# Patient Record
Sex: Female | Born: 1960 | Race: Black or African American | Hispanic: No | Smoking: Never smoker
Health system: Southern US, Community
[De-identification: ages and names within clinical notes are randomized; demographics above are authoritative.]

---

## 2008-10-19 ENCOUNTER — Emergency Department (HOSPITAL_COMMUNITY): Admission: EM | Admit: 2008-10-19 | Discharge: 2008-10-20 | Payer: Self-pay | Admitting: Emergency Medicine

## 2008-10-21 ENCOUNTER — Emergency Department (HOSPITAL_COMMUNITY): Admission: EM | Admit: 2008-10-21 | Discharge: 2008-10-22 | Payer: Self-pay | Admitting: Emergency Medicine

## 2010-03-11 IMAGING — CT CT CERVICAL SPINE W/O CM
4 of 5 series · 16 of 35 positions shown, 17 images · non-contrast
Comparison: None

CT HEAD

CLINICAL DATA: Head and neck pain

CT HEAD WITHOUT CONTRAST
CT CERVICAL SPINE WITHOUT CONTRAST
TECHNIQUE: Multidetector CT imaging of the head and cervical spine
was performed following the standard protocol without intravenous
contrast.  Multiplanar CT image reconstructions of the cervical
spine were also generated.

[Series 3: recon 2: brain · axial · 0.47mm/px · z∈[-102,-18]mm · 4 of 56 slices shown]
[im 12/56  bone]
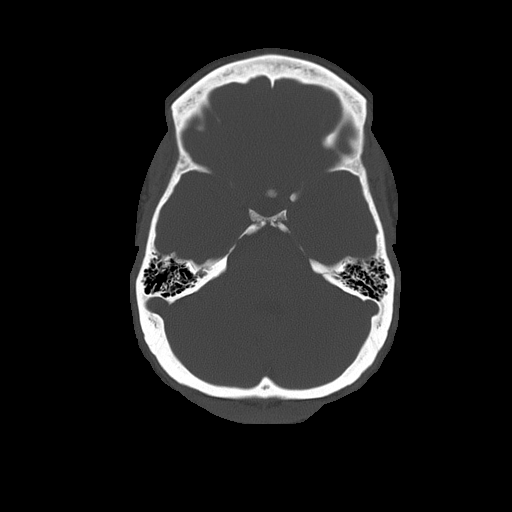
[im 23/56  bone]
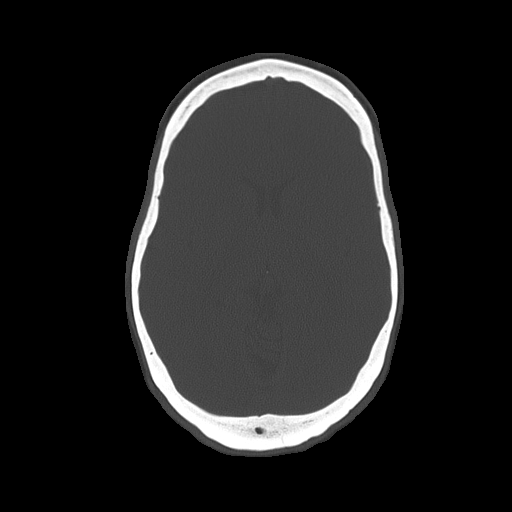
[im 34/56  bone]
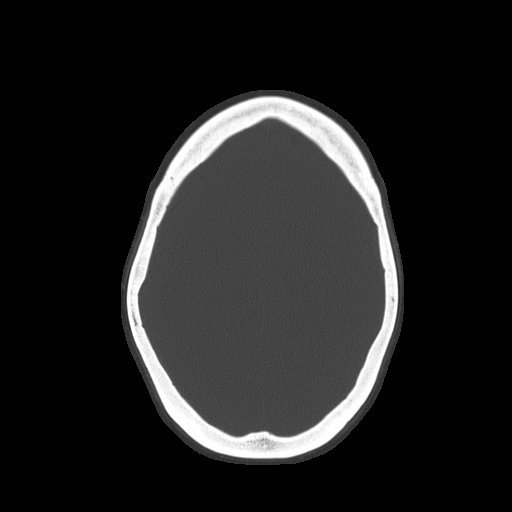
[im 45/56  bone]
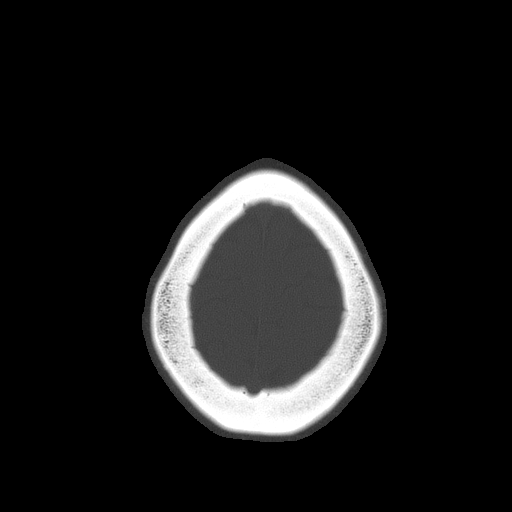

[Series 600: reformatted · sagittal · 0.32mm/px · 6 of 34 slices shown (1 of 3)]
[im 12/34  bone]
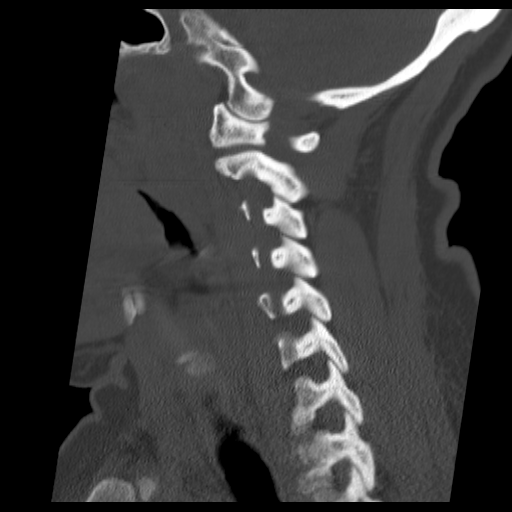
[im 14/34  bone]
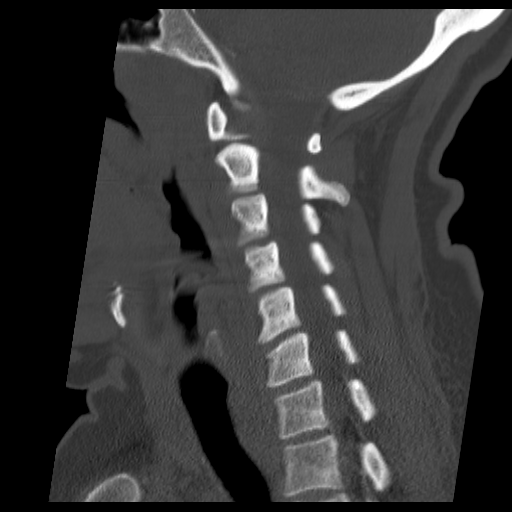
[im 17/34  bone]
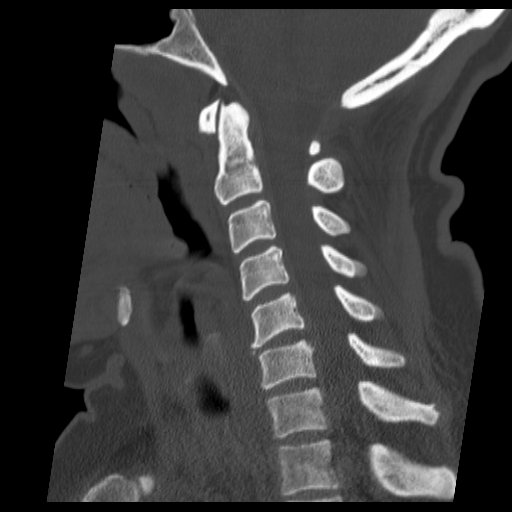
[im 20/34  bone]
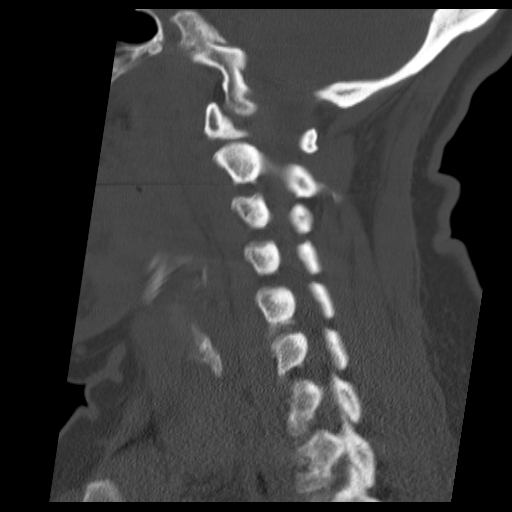
[im 23/34  bone]
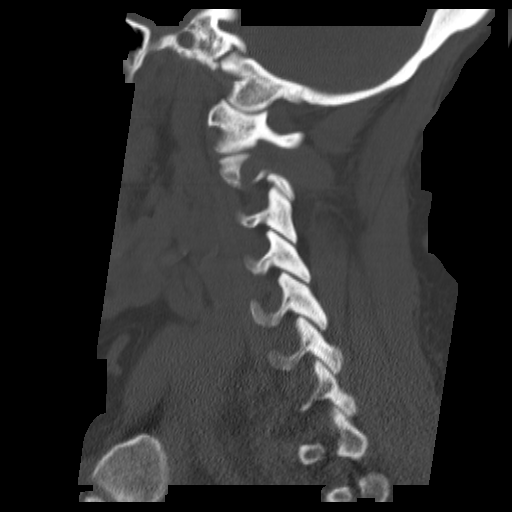
[im 31/34  soft-tissue]
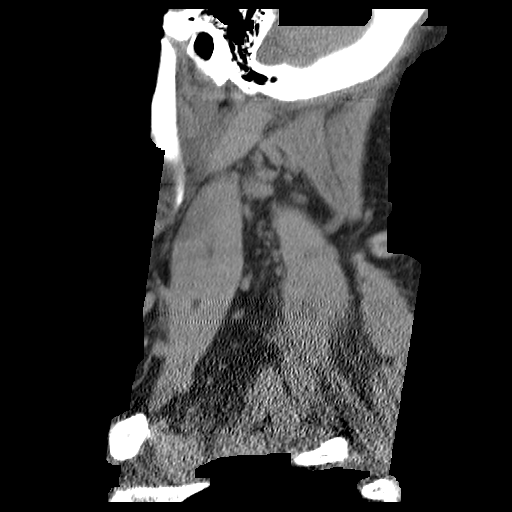

[Series 601: reformatted · coronal · 0.32mm/px · 3 of 27 slices shown (2 of 3)]
[im 6/27  bone]
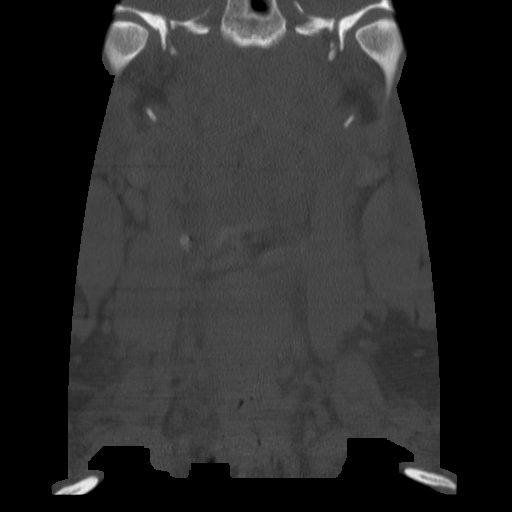
[im 11/27  bone]
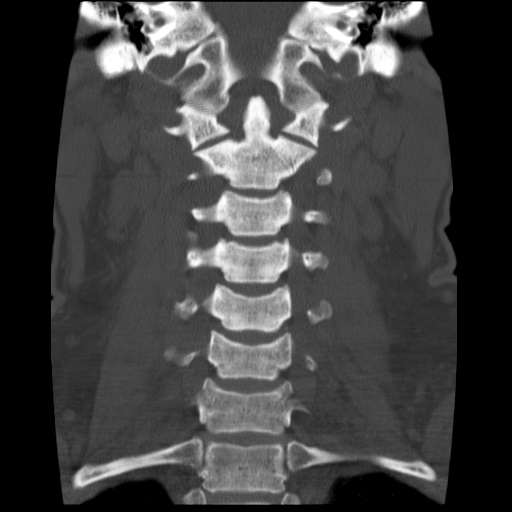
[im 16/27  bone]
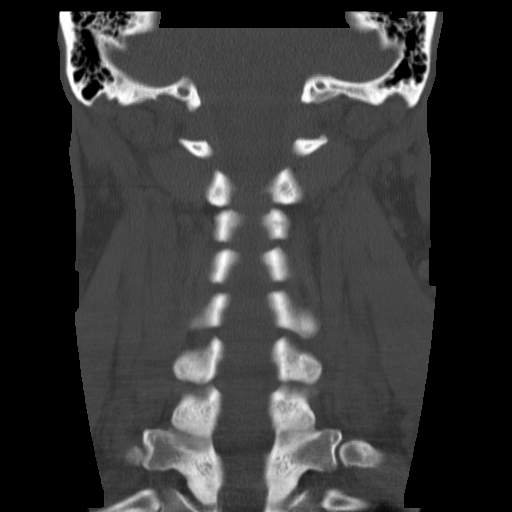

[Series 602: reformatted · axial · 0.23mm/px · z∈[-257,-189]mm · 3 of 49 slices shown, 4 images (3 of 3)]
[im 13/49  soft-tissue]
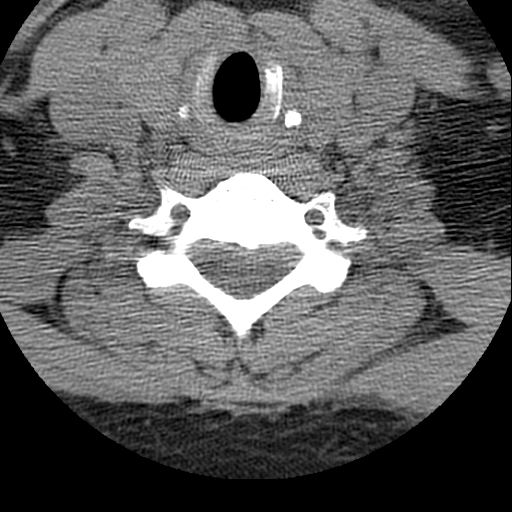
[im 13/49  bone]
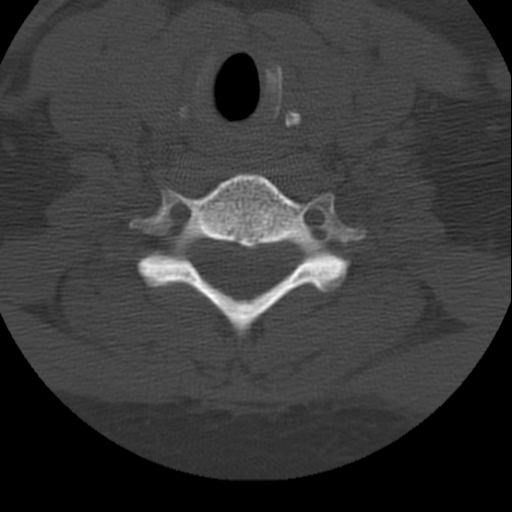
[im 25/49  bone]
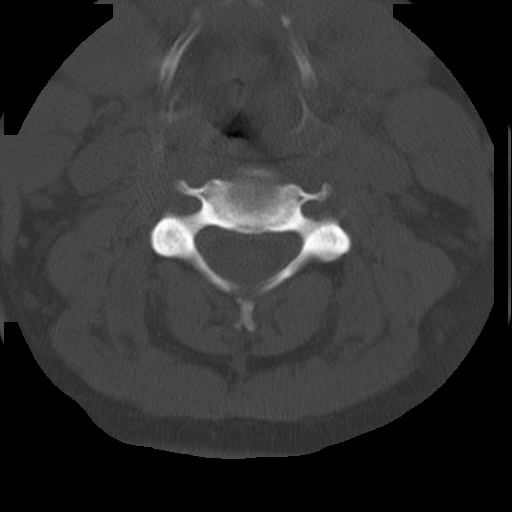
[im 37/49  bone]
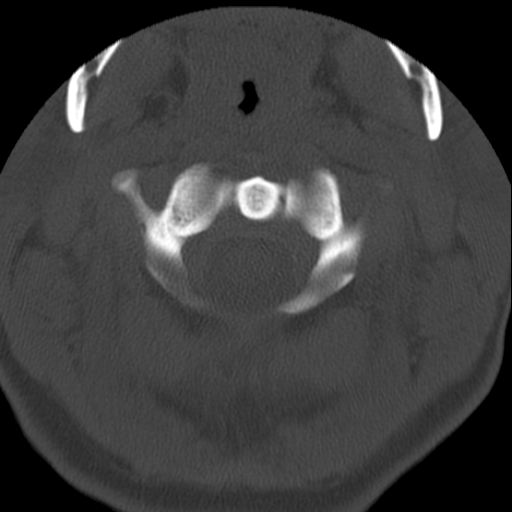

[16 of 35 positions shown; findings below may reference images not displayed]

FINDINGS: The ventricles are normal.  No extra-axial fluid
collections are seen.  No acute intracranial findings or mass
lesions.  The brainstem and cerebellum appear normal.

The bony calvarium is intact.  The visualized paranasal sinuses
mastoid air cells are clear.  The middle ear cavities are clear.
The globes are intact.
IMPRESSION: 1.  No acute intracranial findings or mass lesions.
2.  Clear paranasal sinuses, mastoid air cells and middle ear
cavities.

CT CERVICAL SPINE
FINDINGS: The sagittal reformatted images demonstrate normal
alignment of the cervical vertebral bodies.  No acute bony findings
or significant degenerative changes.  The facets are normally
aligned.  No facet or laminar fractures are seen.  The neural
foramen are patent. The C1-C2 articulations are maintained.  The
dens is normal.

Borderline in neck nodes bilaterally.  Recommend clinical
correlation.
IMPRESSION: 1.  No acute bony findings and normal alignment.
2.  Borderline neck nodes bilaterally.

## 2011-08-10 LAB — POCT I-STAT, CHEM 8
Creatinine, Ser: 0.9 mg/dL (ref 0.4–1.2)
HCT: 37 % (ref 36.0–46.0)
Hemoglobin: 12.6 g/dL (ref 12.0–15.0)
Sodium: 139 mEq/L (ref 135–145)
TCO2: 25 mmol/L (ref 0–100)

## 2011-08-10 LAB — CBC
HCT: 35.7 % — ABNORMAL LOW (ref 36.0–46.0)
MCHC: 34 g/dL (ref 30.0–36.0)
MCV: 87.4 fL (ref 78.0–100.0)
Platelets: 247 10*3/uL (ref 150–400)
RDW: 14.6 % (ref 11.5–15.5)

## 2011-08-10 LAB — DIFFERENTIAL
Basophils Absolute: 0 10*3/uL (ref 0.0–0.1)
Basophils Relative: 1 % (ref 0–1)
Eosinophils Absolute: 0.1 10*3/uL (ref 0.0–0.7)
Eosinophils Relative: 3 % (ref 0–5)
Lymphs Abs: 2.1 10*3/uL (ref 0.7–4.0)

## 2011-12-31 ENCOUNTER — Emergency Department (HOSPITAL_COMMUNITY): Payer: Self-pay

## 2011-12-31 ENCOUNTER — Other Ambulatory Visit: Payer: Self-pay

## 2011-12-31 ENCOUNTER — Emergency Department (HOSPITAL_COMMUNITY)
Admission: EM | Admit: 2011-12-31 | Discharge: 2012-01-01 | Disposition: A | Discharge status: Home / Self Care | Payer: Self-pay | Attending: Emergency Medicine | Admitting: Emergency Medicine

## 2011-12-31 ENCOUNTER — Encounter (HOSPITAL_COMMUNITY): Payer: Self-pay | Admitting: Emergency Medicine

## 2011-12-31 DIAGNOSIS — R0789 Other chest pain: Secondary | ICD-10-CM | POA: Insufficient documentation

## 2011-12-31 DIAGNOSIS — R079 Chest pain, unspecified: Secondary | ICD-10-CM | POA: Insufficient documentation

## 2011-12-31 DIAGNOSIS — R0602 Shortness of breath: Secondary | ICD-10-CM | POA: Insufficient documentation

## 2011-12-31 LAB — BASIC METABOLIC PANEL
GFR calc Af Amer: >90 mL/min (ref >90)
GFR calc non Af Amer: >90 mL/min (ref >90)
Potassium: 3.8 mEq/L (ref 3.5–5.1)
Sodium: 135 mEq/L (ref 135–145)

## 2011-12-31 LAB — CBC
MCH: 29.6 pg (ref 26.0–34.0)
Platelets: 267 10*3/uL (ref 150–400)
RBC: 4.36 MIL/uL (ref 3.87–5.11)

## 2011-12-31 LAB — DIFFERENTIAL
Basophils Relative: 1 % (ref 0–1)
Eosinophils Absolute: 0.2 10*3/uL (ref 0.0–0.7)
Lymphs Abs: 3 10*3/uL (ref 0.7–4.0)
Neutro Abs: 2 10*3/uL (ref 1.7–7.7)
Neutrophils Relative %: 35 % — ABNORMAL LOW (ref 43–77)

## 2011-12-31 LAB — POCT I-STAT TROPONIN I: Troponin i, poc: 0.01 ng/mL (ref 0.00–0.08)

## 2011-12-31 NOTE — ED Notes (Signed)
Pt alert, nad, c/o "chest wall pain", onset several weeks ago, pt states sudden onset, denies trauma or injnury, states pain is non radiating, center of chest, resp even unlabored, skin pwd

## 2011-12-31 NOTE — ED Notes (Signed)
Pt is a very health conscious woman who has lost 68 lbs.  3 wks ago she noticed a tightness in her chest/back where it is hard for her to get a deep breath.  Pt has a knot in between her breasts that she noticed about a week ago.  States that when she rubs her chest it feels a little bit better.

## 2012-01-01 MED ORDER — MELOXICAM 7.5 MG PO TABS: 7.5000 mg | ORAL_TABLET | Freq: Every day | ORAL | Status: AC

## 2012-01-01 NOTE — ED Provider Notes (Signed)
Medical screening examination/treatment/procedure(s) were performed by non-physician practitioner and as supervising physician I was immediately available for consultation/collaboration.   Vida Roller, MD 01/01/12 [phone]

## 2012-01-01 NOTE — ED Provider Notes (Signed)
History     CSN: [id number]  Arrival date & time 12/31/11  2131   First MD Initiated Contact with Patient 12/31/11 2233      Chief Complaint  Patient presents with  . Pleurisy    (Consider location/radiation/quality/duration/timing/severity/associated sxs/prior treatment) HPI Comments: Patient presents with a two-week history of substernal chest pain that does not radiate patient states that the pain is episodic in the center of her chest increases with palpation and deep inspiration reports mild shortness of breath denies nausea vomiting radiation of pain fevers chills cough or congestion  Patient is a 51 y.o. female presenting with chest pain. The history is provided by the patient. No language interpreter was used.  Chest Pain The chest pain began 1 - 2 weeks ago. Chest pain occurs intermittently. The chest pain is unchanged. The pain is associated with breathing. At its most intense, the pain is at 5/10. The pain is currently at 5/10. The severity of the pain is moderate. The quality of the pain is described as aching, dull and pleuritic. The pain does not radiate. Chest pain is worsened by deep breathing. Primary symptoms include shortness of breath. Pertinent negatives for primary symptoms include no fever, no fatigue, no syncope, no cough, no wheezing, no palpitations, no abdominal pain, no nausea, no vomiting, no dizziness and no altered mental status.  Pertinent negatives for associated symptoms include no claudication, no diaphoresis, no lower extremity edema, no near-syncope, no numbness, no orthopnea, no paroxysmal nocturnal dyspnea and no weakness. She tried nothing for the symptoms. Risk factors include no known risk factors.     History reviewed. No pertinent past medical history.  Past Surgical History  Procedure Date  . Cesarean section     No family history on file.  History  Substance Use Topics  . Smoking status: Never Smoker   . Smokeless tobacco: Not on file    . Alcohol Use: No    OB History    Grav Para Term Preterm Abortions TAB SAB Ect Mult Living                  Review of Systems  Constitutional: Negative for fever, diaphoresis and fatigue.  Respiratory: Positive for shortness of breath. Negative for cough and wheezing.   Cardiovascular: Positive for chest pain. Negative for palpitations, orthopnea, claudication, syncope and near-syncope.  Gastrointestinal: Negative for nausea, vomiting and abdominal pain.  Neurological: Negative for dizziness, weakness and numbness.  Psychiatric/Behavioral: Negative for altered mental status.  All other systems reviewed and are negative.    Allergies  Review of patient's allergies indicates no known allergies.  Home Medications  No current outpatient prescriptions on file.  BP 142/93  Pulse 71  Temp(Src) 98.3 F (36.8 C) (Oral)  Resp 18  Wt 198 lb (89.812 kg)  SpO2 95%  Physical Exam  Nursing note and vitals reviewed. Constitutional: She is oriented to person, place, and time. She appears well-developed and well-nourished. No distress.  HENT:  Head: Normocephalic and atraumatic.  Right Ear: External ear normal.  Left Ear: External ear normal.  Nose: Nose normal.  Mouth/Throat: Oropharynx is clear and moist. No oropharyngeal exudate.  Eyes: Conjunctivae are normal. Pupils are equal, round, and reactive to light. No scleral icterus.  Neck: Normal range of motion. Neck supple. No JVD present.  Cardiovascular: Normal rate, regular rhythm and normal heart sounds.  Exam reveals no gallop and no friction rub.   No murmur heard. Pulmonary/Chest: Effort normal and breath sounds normal. No  respiratory distress. She has no wheezes. She has no rales. She exhibits tenderness.       Sternal tenderness to palpation  Abdominal: Soft. Bowel sounds are normal. She exhibits no distension. There is no tenderness.  Musculoskeletal: Normal range of motion. She exhibits no edema and no tenderness.        No peripheral edema  Lymphadenopathy:    She has no cervical adenopathy.  Neurological: She is alert and oriented to person, place, and time. No cranial nerve deficit.  Skin: Skin is warm and dry. No rash noted. No erythema. No pallor.  Psychiatric: She has a normal mood and affect. Her behavior is normal. Judgment and thought content normal.    ED Course  Procedures (including critical care time)  Labs Reviewed  DIFFERENTIAL - Abnormal; Notable for the following:    Neutrophils Relative 35 (*)    Lymphocytes Relative 53 (*)    All other components within normal limits  BASIC METABOLIC PANEL - Abnormal; Notable for the following:    Glucose, Bld 104 (*)    All other components within normal limits  CBC  D-DIMER, QUANTITATIVE  POCT I-STAT TROPONIN I   Dg Chest 2 View  12/31/2011  *RADIOLOGY REPORT*  Clinical Data: Shortness of breath and chest pain.  CHEST - 2 VIEW  Comparison: None.  Findings: The lungs are well-aerated.  Mild retrocardiac opacity could reflect mild pneumonia, slightly less well characterized on the lateral view.  There is no evidence of pleural effusion or pneumothorax.  The heart is normal in size; the mediastinal contour is within normal limits.  No acute osseous abnormalities are seen.  IMPRESSION: Mild retrocardiac opacity could reflect mild pneumonia.  Original Report Authenticated By: Tonia Ghent, M.D.   Results for orders placed during the hospital encounter of 12/31/11  CBC      Component Value Range   WBC 5.7  4.0 - 10.5 (K/uL)   RBC 4.36  3.87 - 5.11 (MIL/uL)   Hemoglobin 12.9  12.0 - 15.0 (g/dL)   HCT 54.0  98.1 - 19.1 (%)   MCV 85.8  78.0 - 100.0 (fL)   MCH 29.6  26.0 - 34.0 (pg)   MCHC 34.5  30.0 - 36.0 (g/dL)   RDW 47.8  29.5 - 62.1 (%)   Platelets 267  150 - 400 (K/uL)  DIFFERENTIAL      Component Value Range   Neutrophils Relative 35 (*) 43 - 77 (%)   Neutro Abs 2.0  1.7 - 7.7 (K/uL)   Lymphocytes Relative 53 (*) 12 - 46 (%)   Lymphs Abs  3.0  0.7 - 4.0 (K/uL)   Monocytes Relative 8  3 - 12 (%)   Monocytes Absolute 0.5  0.1 - 1.0 (K/uL)   Eosinophils Relative 3  0 - 5 (%)   Eosinophils Absolute 0.2  0.0 - 0.7 (K/uL)   Basophils Relative 1  0 - 1 (%)   Basophils Absolute 0.0  0.0 - 0.1 (K/uL)  BASIC METABOLIC PANEL      Component Value Range   Sodium 135  135 - 145 (mEq/L)   Potassium 3.8  3.5 - 5.1 (mEq/L)   Chloride 101  96 - 112 (mEq/L)   CO2 25  19 - 32 (mEq/L)   Glucose, Bld 104 (*) 70 - 99 (mg/dL)   BUN 9  6 - 23 (mg/dL)   Creatinine, Ser 3.08  0.50 - 1.10 (mg/dL)   Calcium 9.7  8.4 - 65.7 (mg/dL)  GFR calc non Af Amer >90  >90 (mL/min)   GFR calc Af Amer >90  >90 (mL/min)  D-DIMER, QUANTITATIVE      Component Value Range   D-Dimer, Quant <0.22  0.00 - 0.48 (ug/mL-FEU)  POCT I-STAT TROPONIN I      Component Value Range   Troponin i, poc 0.01  0.00 - 0.08 (ng/mL)   Comment 3            Dg Chest 2 View  12/31/2011  *RADIOLOGY REPORT*  Clinical Data: Shortness of breath and chest pain.  CHEST - 2 VIEW  Comparison: None.  Findings: The lungs are well-aerated.  Mild retrocardiac opacity could reflect mild pneumonia, slightly less well characterized on the lateral view.  There is no evidence of pleural effusion or pneumothorax.  The heart is normal in size; the mediastinal contour is within normal limits.  No acute osseous abnormalities are seen.  IMPRESSION: Mild retrocardiac opacity could reflect mild pneumonia.  Original Report Authenticated By: Tonia Ghent, M.D.    Date: 01/01/2012  Rate: 66  Rhythm: normal sinus rhythm  QRS Axis: normal  Intervals: PR prolonged  ST/T Wave abnormalities: normal  Conduction Disutrbances:first-degree A-V block   Narrative Interpretation: Reviewed by Dr. Oletta Lamas  Old EKG Reviewed: none available     Atypical chest pain    MDM  Patient here with atypical chest pain, onset several weeks ago, labs normal here, plan for followup with Dr. Sharyn Lull for outpatient stress  testing.  Doubt cardiac in nature - doubt PE - has no PCP here will refer.        Izola Price [place], [place] 01/01/12 [phone]

## 2013-05-19 IMAGING — CR DG CHEST 2V
2 series · 2 of 2 positions shown · non-contrast
Comparison: None.

CLINICAL DATA: Shortness of breath and chest pain.

CHEST - 2 VIEW

[w chest pa]
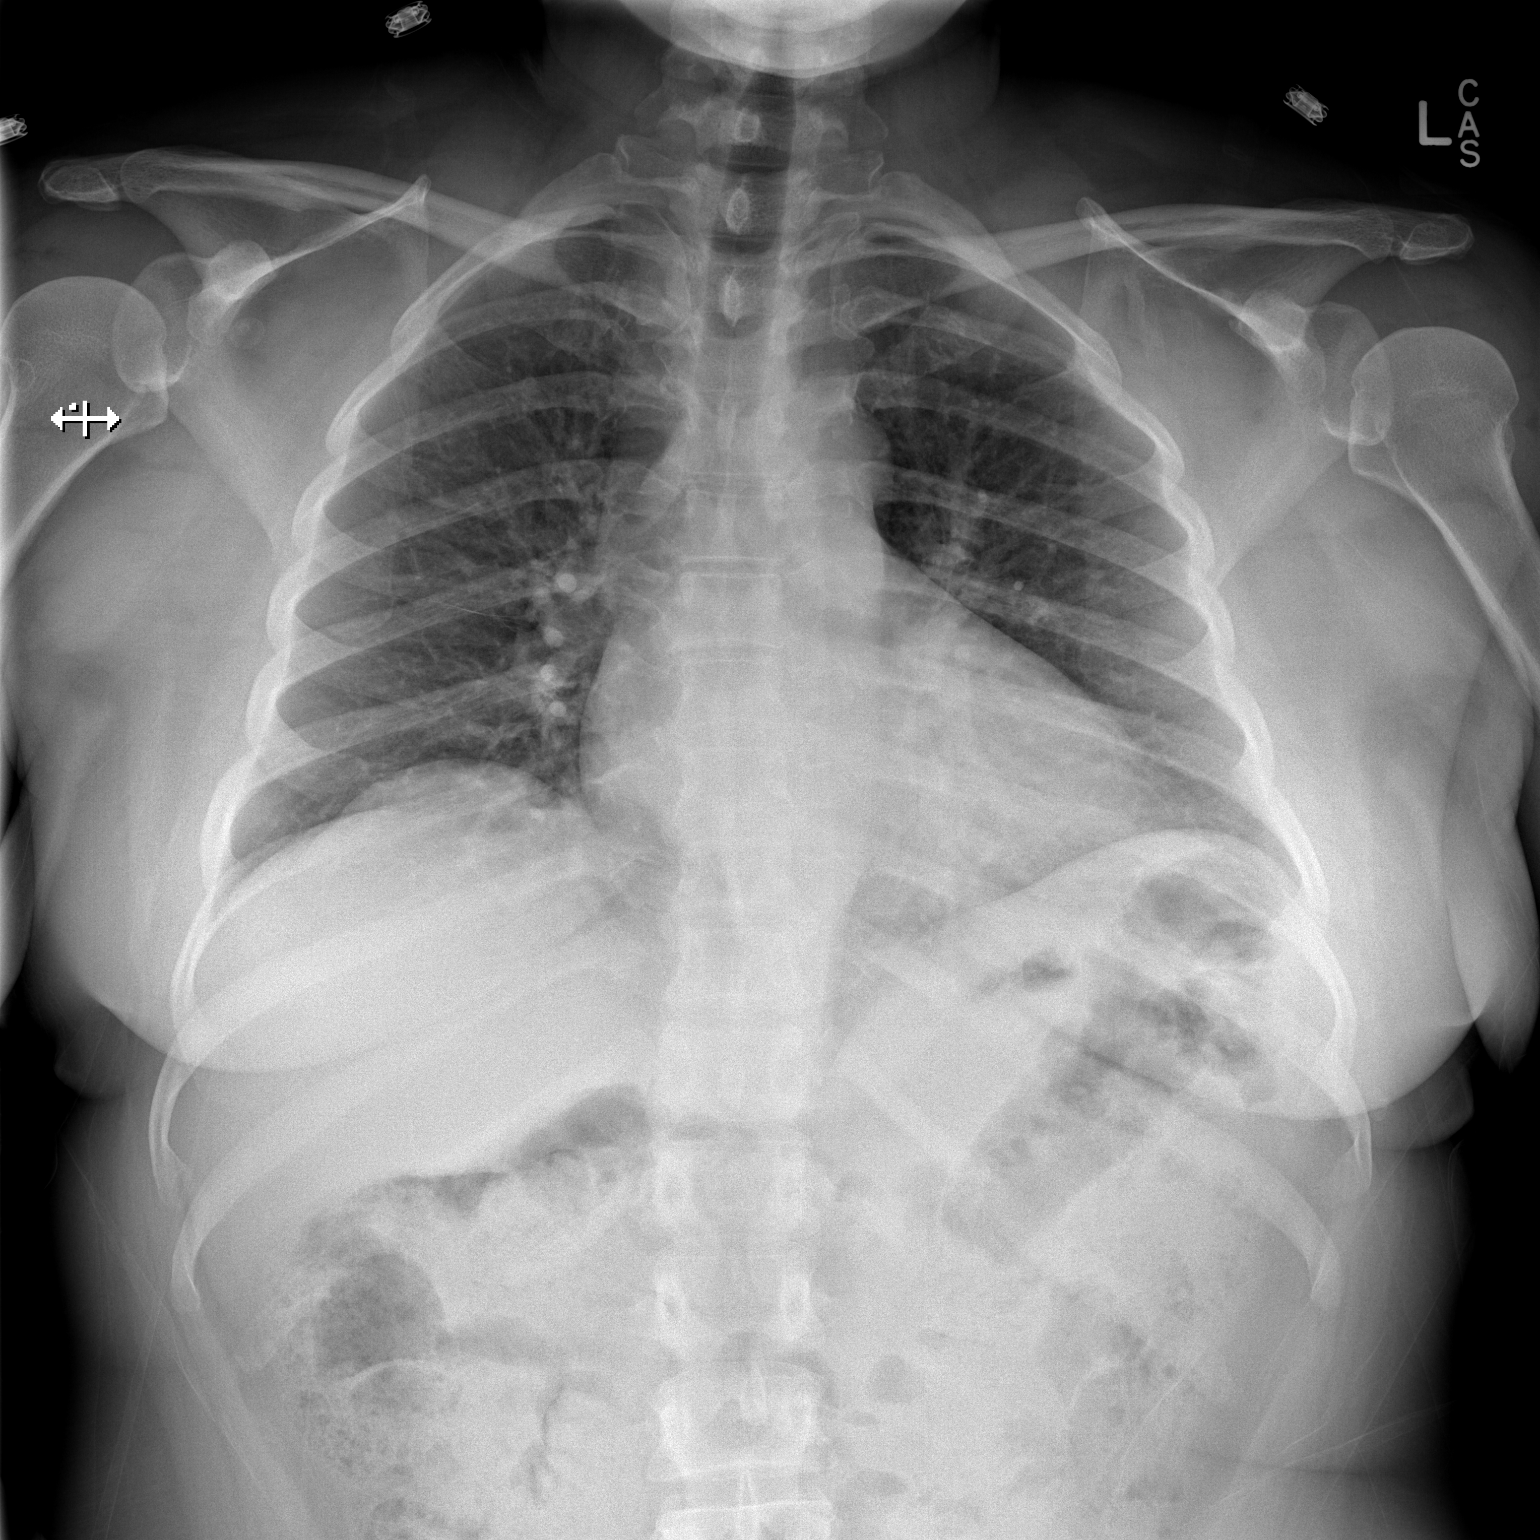

[w chest lat]
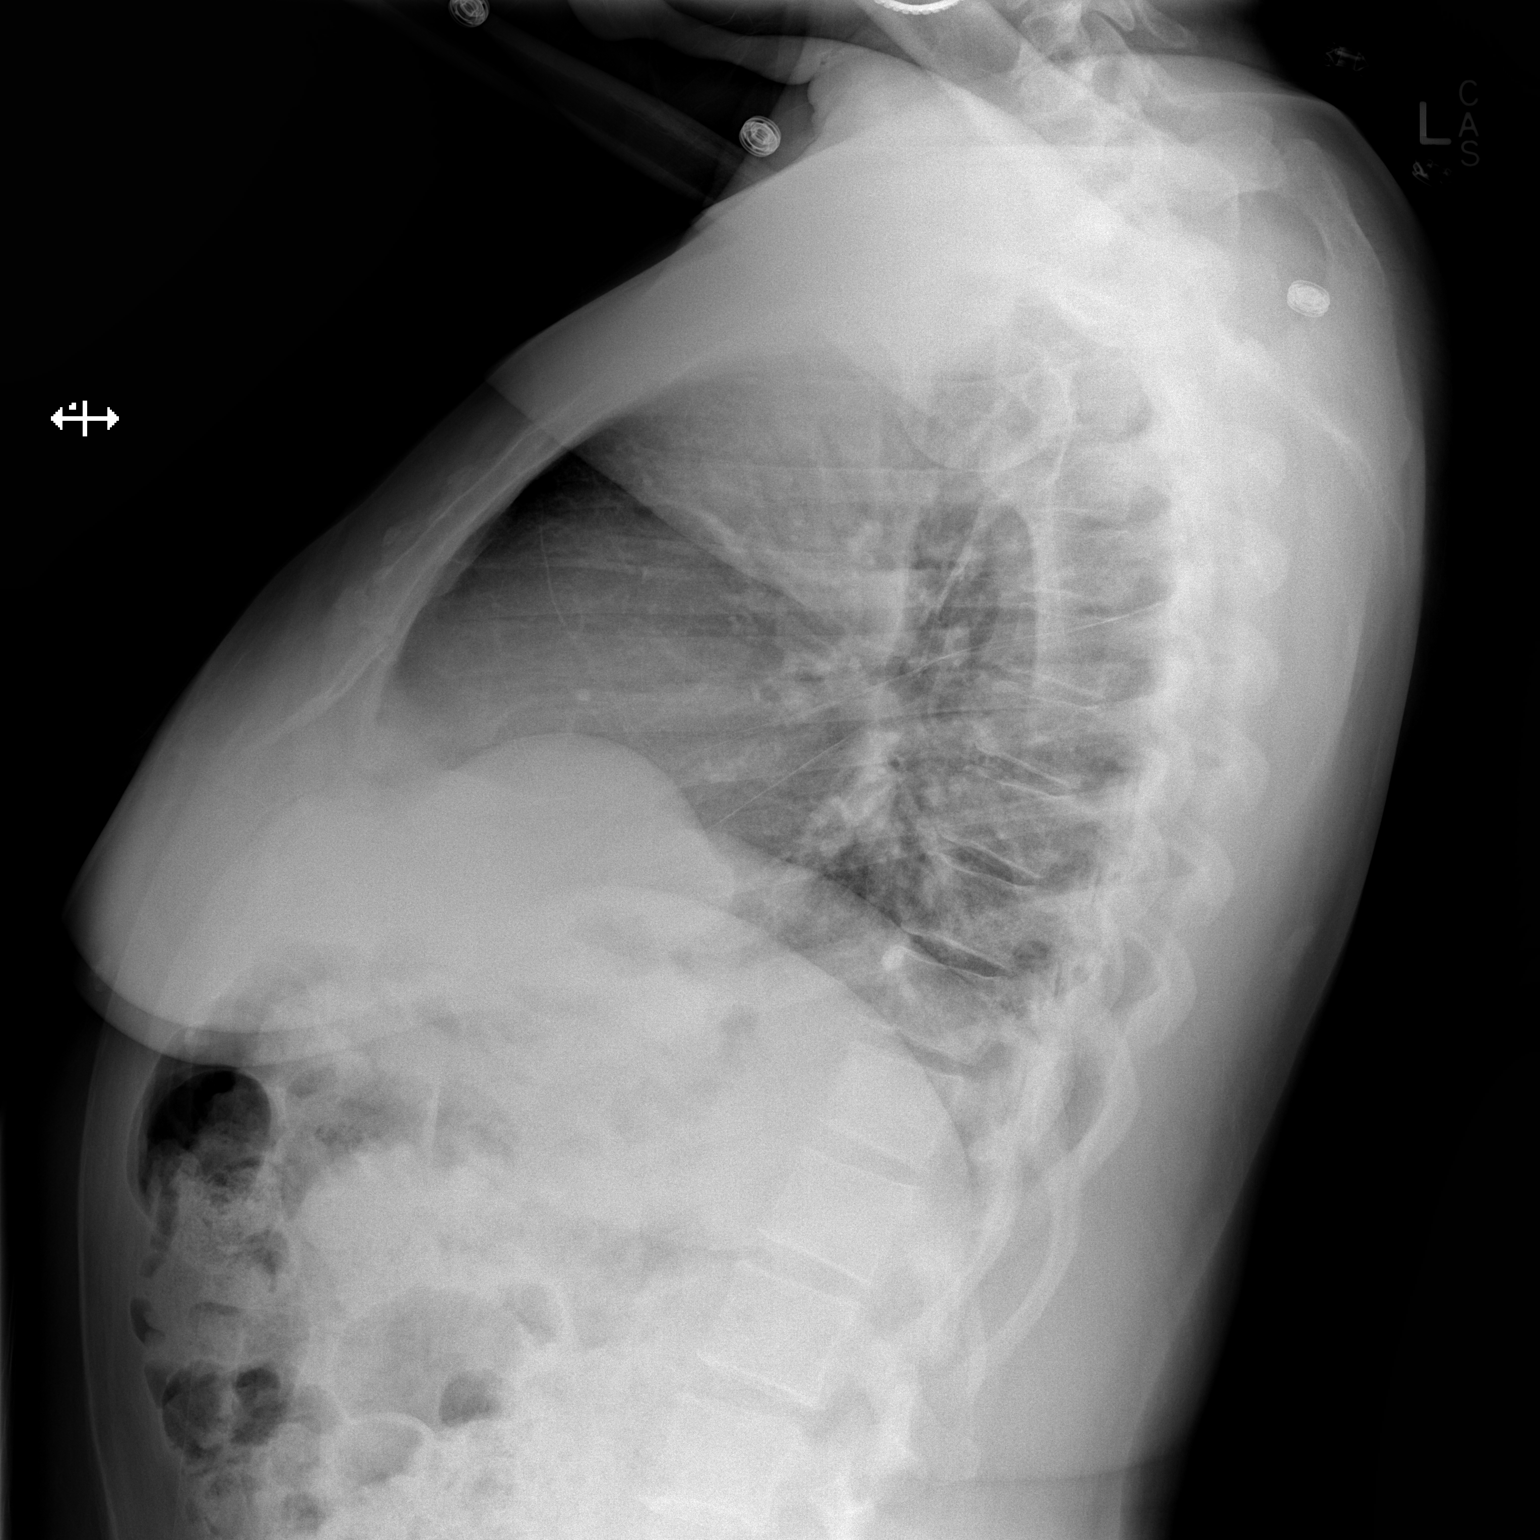

[2 of 2 positions shown; findings below may reference images not displayed]

FINDINGS: The lungs are well-aerated.  Mild retrocardiac opacity
could reflect mild pneumonia, slightly less well characterized on
the lateral view.  There is no evidence of pleural effusion or
pneumothorax.

The heart is normal in size; the mediastinal contour is within
normal limits.  No acute osseous abnormalities are seen.
IMPRESSION: Mild retrocardiac opacity could reflect mild pneumonia.

## 2016-01-17 ENCOUNTER — Other Ambulatory Visit: Payer: Self-pay | Admitting: Family Medicine

## 2016-01-17 DIAGNOSIS — M79604 Pain in right leg: Secondary | ICD-10-CM

## 2016-01-20 ENCOUNTER — Inpatient Hospital Stay: Admission: RE | Admit: 2016-01-20 | Payer: BLUE CROSS/BLUE SHIELD | Source: Ambulatory Visit

## 2016-01-23 ENCOUNTER — Ambulatory Visit
Admission: RE | Admit: 2016-01-23 | Discharge: 2016-01-23 | Disposition: A | Discharge status: Home / Self Care | Payer: BLUE CROSS/BLUE SHIELD | Source: Ambulatory Visit | Attending: Family Medicine | Admitting: Family Medicine

## 2016-01-23 DIAGNOSIS — M79604 Pain in right leg: Secondary | ICD-10-CM

## 2016-06-25 ENCOUNTER — Other Ambulatory Visit: Payer: Self-pay | Admitting: Gastroenterology

## 2016-06-25 DIAGNOSIS — R1033 Periumbilical pain: Secondary | ICD-10-CM

## 2016-11-11 ENCOUNTER — Emergency Department (HOSPITAL_COMMUNITY)
Admission: EM | Admit: 2016-11-11 | Discharge: 2016-11-12 | Disposition: A | Discharge status: Home / Self Care | Payer: BLUE CROSS/BLUE SHIELD | Attending: Emergency Medicine | Admitting: Emergency Medicine

## 2016-11-11 DIAGNOSIS — R112 Nausea with vomiting, unspecified: Secondary | ICD-10-CM | POA: Insufficient documentation

## 2016-11-11 DIAGNOSIS — R1084 Generalized abdominal pain: Secondary | ICD-10-CM | POA: Diagnosis present

## 2016-11-11 DIAGNOSIS — Z5321 Procedure and treatment not carried out due to patient leaving prior to being seen by health care provider: Secondary | ICD-10-CM | POA: Diagnosis not present

## 2016-11-12 ENCOUNTER — Encounter (HOSPITAL_COMMUNITY): Payer: Self-pay | Admitting: Oncology

## 2016-11-12 NOTE — ED Triage Notes (Signed)
Pt c/o generalized abdominal pain since July.  Pt's daughter states she ate something spicy tonight which caused the pain to exacerbate.  Pt states she has N/V.  Rates pain 10/10.

## 2016-11-12 NOTE — ED Notes (Signed)
When pt advised of the wait time she stated, "Take me to [facility]."  This Clinical research associatewriter asked pt if she was leaving to which pt responded, "What does it look like?"  Will d/c pt LWBS.

## 2017-06-11 IMAGING — US US EXTREM LOW VENOUS*R*
1 series · 13 of 24 positions shown · non-contrast
Comparison: None.

CLINICAL DATA: One month history of right leg pain and edema.



[Series 1: us extrem low venous*right* · 13 of 29 slices shown]
[im 1/29]
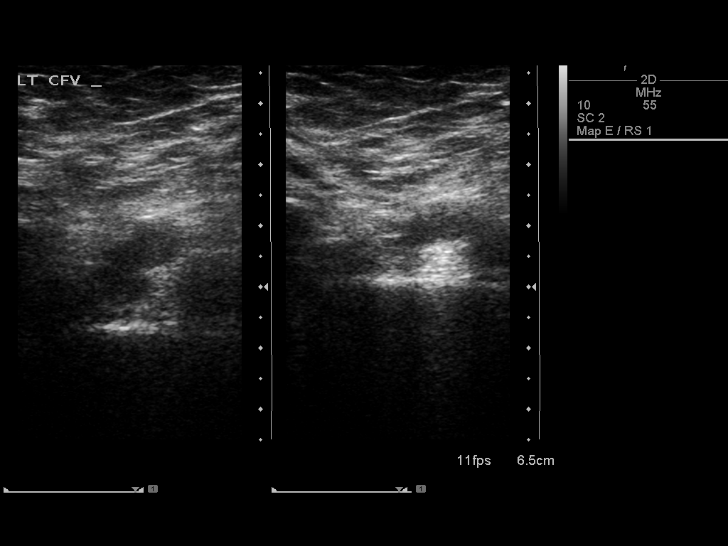
[im 3/29]
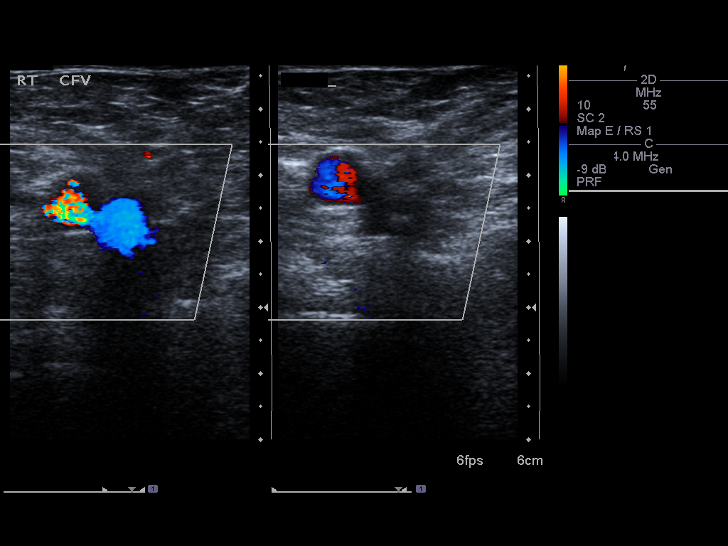
[im 5/29]
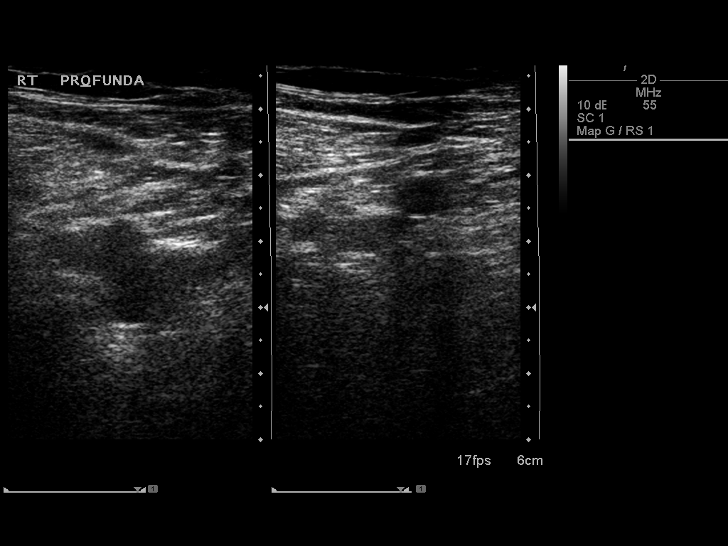
[im 8/29]
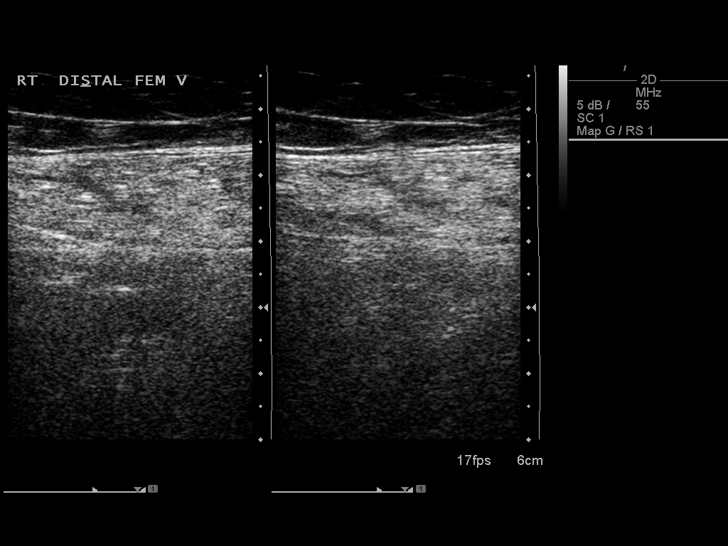
[im 10/29]
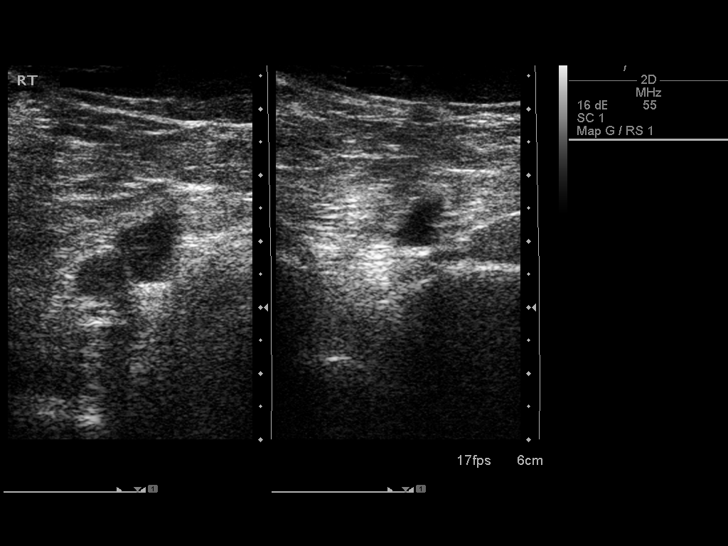
[im 13/29]
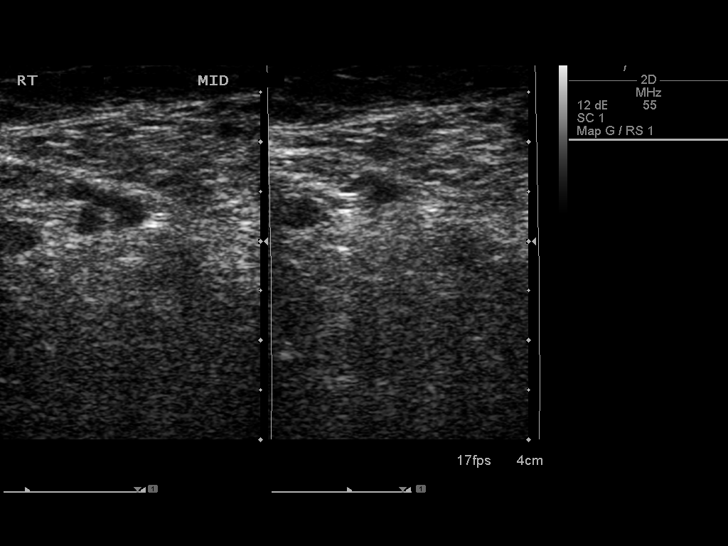
[im 15/29]
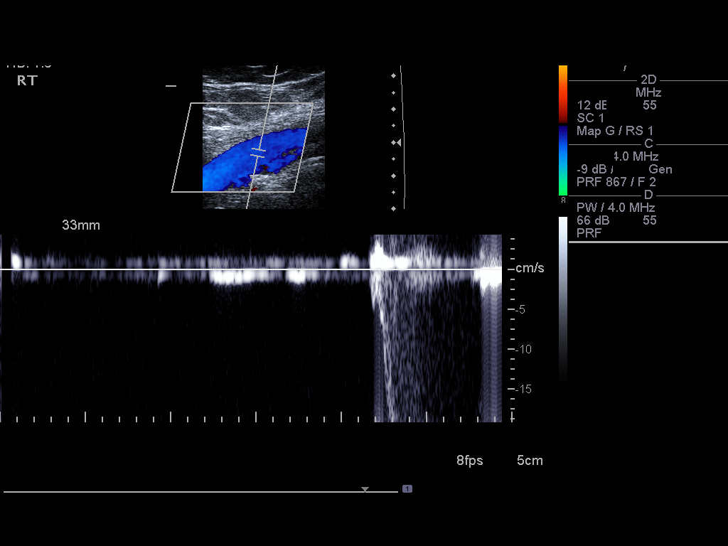
[im 16/29]
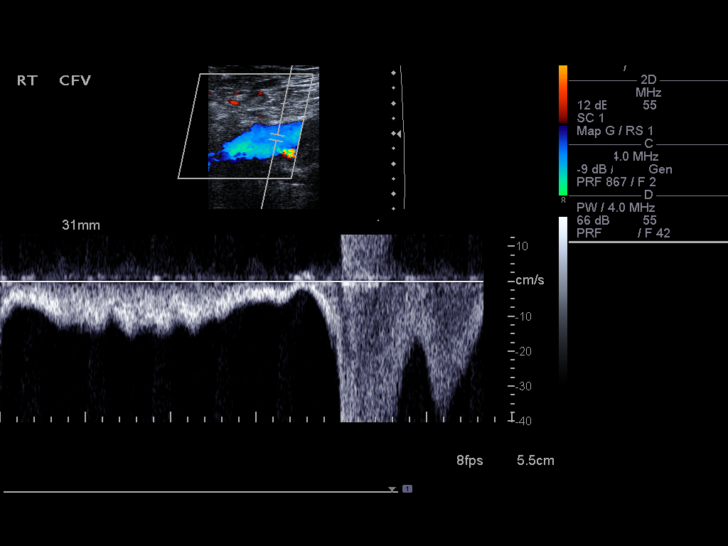
[im 19/29]
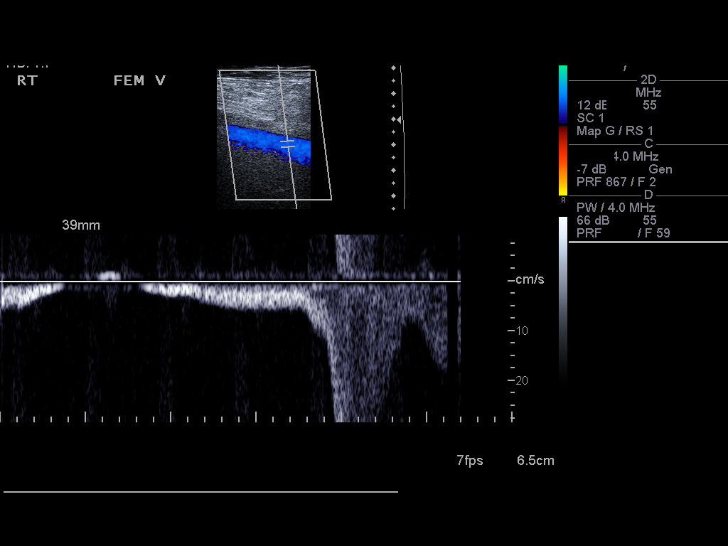
[im 21/29]
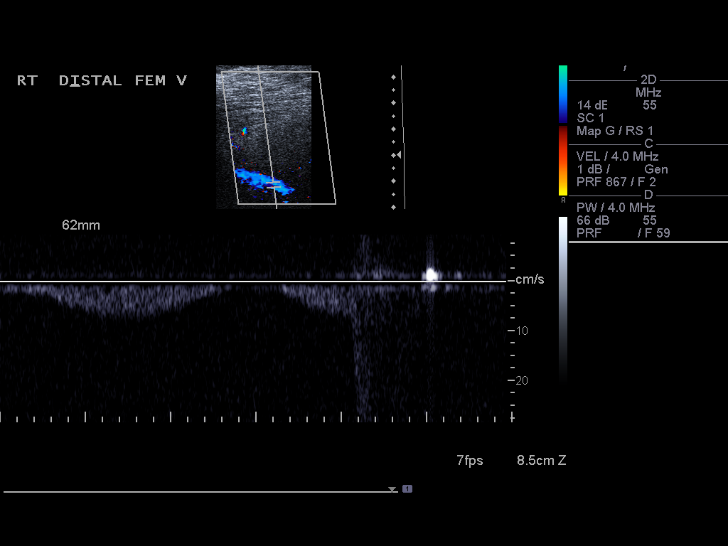
[im 24/29]
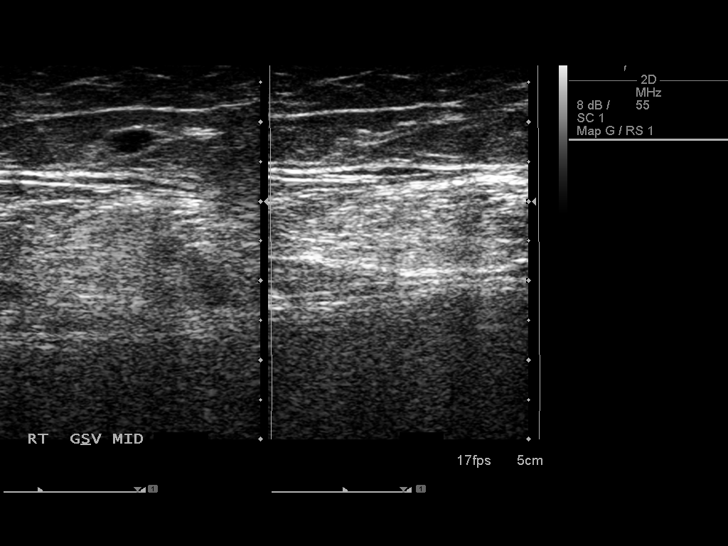
[im 26/29]
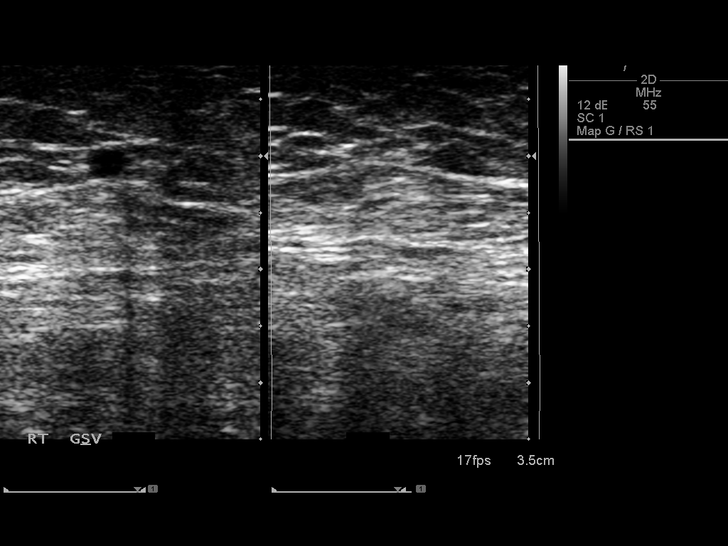
[im 29/29]
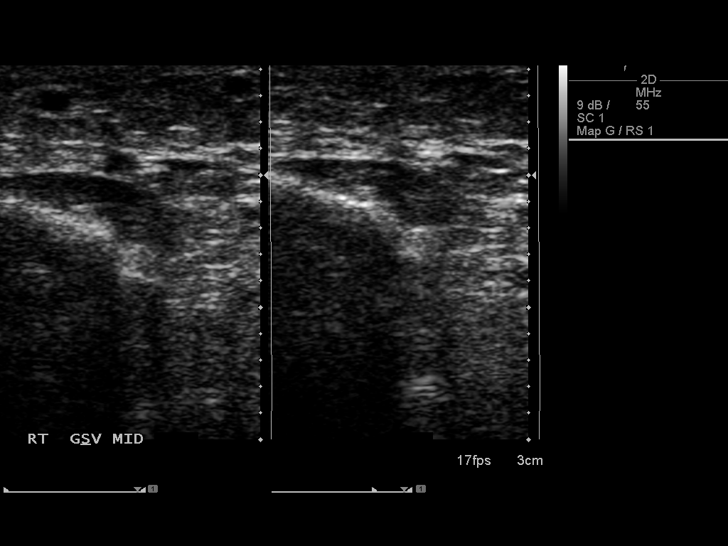

[13 of 24 positions shown; findings below may reference images not displayed]

FINDINGS: Contralateral Common Femoral Vein: Respiratory phasicity is normal
and symmetric with the symptomatic side. No evidence of thrombus.
Normal compressibility.

Common Femoral Vein: No evidence of thrombus. Normal
compressibility, respiratory phasicity and response to augmentation.

Saphenofemoral Junction: No evidence of thrombus. Normal
compressibility and flow on color Doppler imaging.

Profunda Femoral Vein: No evidence of thrombus. Normal
compressibility and flow on color Doppler imaging.

Femoral Vein: No evidence of thrombus. Normal compressibility,
respiratory phasicity and response to augmentation.

Popliteal Vein: No evidence of thrombus. Normal compressibility,
respiratory phasicity and response to augmentation.

Calf Veins: Limited visualization of the posterior tibial vein but
appears grossly patent. Peroneal vein not demonstrated.

Superficial Great Saphenous Vein: No evidence of thrombus. Normal
compressibility and flow on color Doppler imaging.

Venous Reflux:  None.

Other Findings:  None.
IMPRESSION: No significant right lower extremity DVT.

## 2020-06-13 ENCOUNTER — Telehealth: Payer: BLUE CROSS/BLUE SHIELD | Admitting: Emergency Medicine

## 2020-06-13 ENCOUNTER — Other Ambulatory Visit: Payer: Self-pay

## 2020-07-06 ENCOUNTER — Telehealth: Payer: Self-pay | Admitting: General Practice

## 2020-07-06 NOTE — Telephone Encounter (Addendum)
Attempted to call patient to schedule patient for a new patient appointment. Unable to LVM due to voicemail not being set up.

## 2022-08-28 ENCOUNTER — Other Ambulatory Visit: Payer: Self-pay

## 2022-08-28 ENCOUNTER — Emergency Department (HOSPITAL_COMMUNITY)
Admission: EM | Admit: 2022-08-28 | Discharge: 2022-08-28 | Disposition: A | Discharge status: Home / Self Care | Payer: Self-pay | Attending: Emergency Medicine | Admitting: Emergency Medicine

## 2022-08-28 ENCOUNTER — Emergency Department (HOSPITAL_COMMUNITY): Payer: BLUE CROSS/BLUE SHIELD

## 2022-08-28 ENCOUNTER — Encounter (HOSPITAL_COMMUNITY): Payer: Self-pay

## 2022-08-28 DIAGNOSIS — M25551 Pain in right hip: Secondary | ICD-10-CM | POA: Insufficient documentation

## 2022-08-28 DIAGNOSIS — Y92511 Restaurant or cafe as the place of occurrence of the external cause: Secondary | ICD-10-CM | POA: Insufficient documentation

## 2022-08-28 DIAGNOSIS — W010XXA Fall on same level from slipping, tripping and stumbling without subsequent striking against object, initial encounter: Secondary | ICD-10-CM | POA: Insufficient documentation

## 2022-08-28 DIAGNOSIS — M25561 Pain in right knee: Secondary | ICD-10-CM

## 2022-08-28 DIAGNOSIS — S8001XA Contusion of right knee, initial encounter: Secondary | ICD-10-CM | POA: Insufficient documentation

## 2022-08-28 DIAGNOSIS — Z91040 Latex allergy status: Secondary | ICD-10-CM | POA: Insufficient documentation

## 2022-08-28 DIAGNOSIS — M25461 Effusion, right knee: Secondary | ICD-10-CM

## 2022-08-28 DIAGNOSIS — W19XXXA Unspecified fall, initial encounter: Secondary | ICD-10-CM

## 2022-08-28 DIAGNOSIS — S93401A Sprain of unspecified ligament of right ankle, initial encounter: Secondary | ICD-10-CM | POA: Insufficient documentation

## 2022-08-28 MED ORDER — IBUPROFEN 200 MG PO TABS
600.0000 mg | ORAL_TABLET | Freq: Once | ORAL | Status: AC
  Administered 2022-08-28: 600 mg via ORAL
  Filled 2022-08-28: qty 3

## 2022-08-28 MED ORDER — OXYCODONE-ACETAMINOPHEN 5-325 MG PO TABS
2.0000 | ORAL_TABLET | Freq: Once | ORAL | Status: AC
  Administered 2022-08-28: 2 via ORAL
  Filled 2022-08-28: qty 2

## 2022-08-28 NOTE — Discharge Instructions (Addendum)
Please use Tylenol or ibuprofen for pain.  You may use 600 mg ibuprofen every 6 hours or 1000 mg of Tylenol every 6 hours.  You may choose to alternate between the 2.  This would be most effective.  Not to exceed 4 g of Tylenol within 24 hours.  Not to exceed 3200 mg ibuprofen 24 hours.  The most important thing over the next few days is going to be rest, ice, compression, elevation of the affected extremity.  I am providing you some crutches so that you can take some weight off of the right leg, I recommend that you keep it elevated in bed, apply ice directly where it hurts for 15 minutes at a time before taking 15-minute break, you can continue to repeat this as needed throughout the day.  Try to bear weight on the right lower extremity as tolerated as the pain begins to subside, and if you are still having significant difficulty walking after 1 to 2 weeks I recommend that you follow-up with the orthopedic physician whose contact information I provided for further evaluation and management.

## 2022-08-28 NOTE — ED Triage Notes (Signed)
Patient reports that she was at a restaurant yesterday and the floor was wet. Patient states she slid and her right leg was folded under her. Patient c/o right knee pain and right great toe pain.  Patient states she has bruising to her right side. Patient denies hitting her head.

## 2022-08-28 NOTE — ED Provider Notes (Signed)
[facility] DEPT Provider Note   CSN: [id number] Arrival date & time: 08/28/22  1344     History  No chief complaint on file.   Gabrielle Ryan is a 61 y.o. female with noncontributory past medical history who presents with concern for right knee, ankle, great toe pain as well as general right-sided pain after a fall at a restaurant yesterday.  Patient reports the floor was wet, she slid, and fell such that her right leg was bended underneath her.  Patient reports that she has not been able to bear weight on it since then.  Patient denies numbness, tingling, denies any head, neck injury.  She reports that she been using some ibuprofen but has not had anything for greater than 10 hours.  HPI     Home Medications Prior to Admission medications   Not on File      Allergies    Latex    Review of Systems   Review of Systems  Musculoskeletal:  Positive for arthralgias and gait problem.  All other systems reviewed and are negative.   Physical Exam Updated Vital Signs BP (!) 178/88   Pulse 69   Temp 97.9 F (36.6 C) (Oral)   Resp 16   Ht 5\' 2"  (1.575 m)   Wt 85.7 kg   SpO2 95%   BMI 34.57 kg/m  Physical Exam Vitals and nursing note reviewed.  Constitutional:      General: She is not in acute distress.    Appearance: Normal appearance.  HENT:     Head: Normocephalic and atraumatic.  Eyes:     General:        Right eye: No discharge.        Left eye: No discharge.  Cardiovascular:     Rate and Rhythm: Normal rate and regular rhythm.     Pulses: Normal pulses.  Pulmonary:     Effort: Pulmonary effort is normal. No respiratory distress.  Musculoskeletal:        General: Swelling present. No deformity.     Comments: Patient with tender, swollen right knee with mild effusion.  No warmth, redness, normal range of motion of the right knee with some pain.  Decreased strength to flexion at the knee secondary to pain.  No obvious laxity to  anterior, posterior drawer test, or varus, valgus laxity.  McMurray not tolerated secondary to pain. patient with intact flexion and extension at the right ankle, without any step-off or deformity.  She has some tenderness over the lateral aspect of the right foot consistent with possible mild to moderate ATFL sprain.  She is having difficulty bearing weight or ambulating at this time secondary to pain.  She has minimal tenderness over the right great trochanter without any leg length discrepancy, normal strength and range of motion to flexion, extension, abduction, adduction at the right hip.  Skin:    General: Skin is warm and dry.     Capillary Refill: Capillary refill takes less than 2 seconds.     Comments: Focal bruising noted over the right tibia, right knee, no fluid-filled mass suggesting hematoma, seroma, or abscess  Neurological:     Mental Status: She is alert and oriented to person, place, and time.  Psychiatric:        Mood and Affect: Mood normal.        Behavior: Behavior normal.     ED Results / Procedures / Treatments   Labs (all labs ordered are listed, but only  abnormal results are displayed) Labs Reviewed - No data to display  EKG None  Radiology DG Foot Complete Right  Result Date: 08/28/2022 CLINICAL DATA:  Post fall, now with right great toe pain. EXAM: RIGHT FOOT COMPLETE - 3+ VIEW COMPARISON:  None Available. FINDINGS: No fracture or dislocation. Mild degenerative change the first MTP joint with joint space loss and subchondral sclerosis. No significant hallux valgus deformity. Remaining joint spaces appear preserved. No erosions. Small plantar calcaneal spur. Enthesopathic change involving the Achilles tendon insertion site. Regional soft tissues appear normal. No radiopaque foreign body. IMPRESSION: 1. No acute findings. 2. Mild degenerative change of the first MTP joint without associated hallux valgus deformity. 3. Small plantar calcaneal spur. Electronically  Signed   By: Simonne Come M.D.   On: 08/28/2022 15:03   DG Knee Complete 4 Views Right  Result Date: 08/28/2022 CLINICAL DATA:  Post fall, now with right knee pain. EXAM: RIGHT KNEE - COMPLETE 4+ VIEW COMPARISON:  None Available. FINDINGS: No fracture or dislocation. 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 There is a small amount of chondrocalcinosis seen within the lateral joint space. Suspected small knee joint effusion. Regional soft tissues appear normal. No radiopaque foreign body. IMPRESSION: 1. Small knee joint effusion.  Otherwise, no acute findings. 2. 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 3. 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 Electronically Signed   By: Simonne Come M.D.   On: 08/28/2022 15:02    Procedures Procedures    Medications Ordered in ED Medications  oxyCODONE-acetaminophen (PERCOCET/ROXICET) 5-325 MG per tablet 2 tablet (has no administration in time range)  ibuprofen (ADVIL) tablet 600 mg (has no administration in time range)    ED Course/ Medical Decision Making/ A&P                           Medical Decision Making Amount and/or Complexity of Data Reviewed Radiology: ordered.  Risk OTC drugs. Prescription drug management.   This is a patient in some distress secondary to pain who presents with concern for right knee, right ankle, right foot pain as well as some less intense pain of the right hip and right side overall.  She did not have any head or neck injuries on exam.  On exam she has some significant tenderness of the right knee with overall intact range of motion, very slightly decreased secondary to mild joint effusion, low clinical suspicion for septic arthritis.  No emergent medical diagnosis includes acute  fracture, dislocation versus collateral ligament injury, ankle sprain, less clinical suspicion for hip fracture, hip dislocation, ankle dislocation, or patellar dislocation.  She is overall neurovascularly intact on my exam, with some significant decrease in range of motion and effective movement secondary to pain.  I independently interpreted imaging including plain film radiograph of the right knee and right foot which shows moderate tricompartmental disease of the knee with some mild effusion, but no evidence of fracture or dislocation, right foot with no acute findings but some degenerative changes of the right MTP. I agree with the radiologist interpretation.  We will help to control patient's pain in the emergency department today, encouraged rest, ice, compression, elevation, and close orthopedic follow-up, will provide right knee brace, right ankle brace, crutches.  Encouraged weightbearing as tolerated over the next several  days, with rehab activities as tolerated as well.  Final Clinical Impression(s) / ED Diagnoses Final diagnoses:  Effusion of right knee  Acute pain of right knee  Fall, initial encounter  Moderate right ankle sprain, initial encounter    Rx / DC Orders ED Discharge Orders     None         Olene Floss, PA-C 08/28/22 1534    Charlynne Pander, MD 08/28/22 2303

## 2023-09-26 ENCOUNTER — Emergency Department (HOSPITAL_COMMUNITY): Payer: BLUE CROSS/BLUE SHIELD

## 2023-09-26 ENCOUNTER — Other Ambulatory Visit: Payer: Self-pay

## 2023-09-26 ENCOUNTER — Emergency Department (HOSPITAL_COMMUNITY)
Admission: EM | Admit: 2023-09-26 | Discharge: 2023-09-26 | Disposition: A | Discharge status: Home / Self Care | Payer: BLUE CROSS/BLUE SHIELD | Attending: Emergency Medicine | Admitting: Emergency Medicine

## 2023-09-26 ENCOUNTER — Encounter (HOSPITAL_COMMUNITY): Payer: Self-pay

## 2023-09-26 DIAGNOSIS — Z91040 Latex allergy status: Secondary | ICD-10-CM | POA: Insufficient documentation

## 2023-09-26 DIAGNOSIS — S82831A Other fracture of upper and lower end of right fibula, initial encounter for closed fracture: Secondary | ICD-10-CM | POA: Diagnosis not present

## 2023-09-26 DIAGNOSIS — W228XXA Striking against or struck by other objects, initial encounter: Secondary | ICD-10-CM | POA: Insufficient documentation

## 2023-09-26 DIAGNOSIS — S82839A Other fracture of upper and lower end of unspecified fibula, initial encounter for closed fracture: Secondary | ICD-10-CM

## 2023-09-26 DIAGNOSIS — S99921A Unspecified injury of right foot, initial encounter: Secondary | ICD-10-CM | POA: Diagnosis present

## 2023-09-26 MED ORDER — OXYCODONE-ACETAMINOPHEN 5-325 MG PO TABS: 1.0000 | ORAL_TABLET | Freq: Four times a day (QID) | ORAL | 0 refills | Status: AC | PRN

## 2023-09-26 MED ORDER — OXYCODONE-ACETAMINOPHEN 5-325 MG PO TABS
1.0000 | ORAL_TABLET | Freq: Once | ORAL | Status: AC
  Administered 2023-09-26: 1 via ORAL
  Filled 2023-09-26: qty 1

## 2023-09-26 NOTE — Care Management (Addendum)
    Durable Medical Equipment  (From admission, onward)           Start     Ordered   09/26/23 0000  For home use only DME 4 wheeled rolling walker with seat       Question:  Patient needs a walker to treat with the following condition  Answer:  Ankle fracture   09/26/23 1620

## 2023-09-26 NOTE — ED Triage Notes (Signed)
Patient reports she backed into a table hitting her right ankle/foot. Unable to bear weight.

## 2023-09-26 NOTE — Care Management (Signed)
    Durable Medical Equipment  (From admission, onward)           Start     Ordered   09/26/23 0000  For home use only DME standard manual wheelchair with seat cushion       Comments: Patient suffers from ankle fracture which impairs their ability to perform daily activities like bathing, dressing, and feeding in the home.  A walker will not resolve issue with performing activities of daily living. A wheelchair will allow patient to safely perform daily activities. Patient can safely propel the wheelchair in the home or has a caregiver who can provide assistance. Length of need 6 months . Accessories: elevating leg rests (ELRs), wheel locks, extensions and anti-tippers.   09/26/23 1636          Addendum for DME order  Michel Bickers RN, BSN CNOR ED RN Care Manager [phone]

## 2023-09-26 NOTE — ED Provider Notes (Signed)
[place] EMERGENCY DEPARTMENT AT [facility] Provider Note   CSN: [id number] Arrival date & time: 09/26/23  1330     History  Chief Complaint  Patient presents with   Foot Injury    Tiffanee Tsai is a 62 y.o. female here presenting with right foot injury.  Patient states that about 3 days ago, she backed into a table and hit her right ankle.  She states that she noticed swelling in the posterior aspect of the ankle.  Patient states that she has hard time bearing weight due to pain.   The history is provided by the patient.       Home Medications Prior to Admission medications   Not on File      Allergies    Latex    Review of Systems   Review of Systems  Musculoskeletal:        R ankle pain   All other systems reviewed and are negative.   Physical Exam Updated Vital Signs BP (!) 146/85   Pulse 65   Temp 98 F (36.7 C) (Oral)   Resp 18   Ht 5\' 2"  (1.575 m)   Wt 88.5 kg   SpO2 94%   BMI 35.67 kg/m  Physical Exam Vitals and nursing note reviewed.  HENT:     Head: Normocephalic.     Nose: Nose normal.     Mouth/Throat:     Mouth: Mucous membranes are moist.  Eyes:     Pupils: Pupils are equal, round, and reactive to light.  Cardiovascular:     Rate and Rhythm: Normal rate.     Pulses: Normal pulses.  Pulmonary:     Effort: Pulmonary effort is normal.  Abdominal:     General: Abdomen is flat.  Musculoskeletal:     Cervical back: Normal range of motion.     Comments: Some tenderness over the posterior aspect of the lateral malleolus.  Patient has some tenderness over the achilles tendon but has negative Thompson sign.  No foot tenderness.  2+ DP pulse.  Neurological:     General: No focal deficit present.     Mental Status: She is alert and oriented to person, place, and time.  Psychiatric:        Mood and Affect: Mood normal.        Behavior: Behavior normal.     ED Results / Procedures / Treatments   Labs (all labs ordered  are listed, but only abnormal results are displayed) Labs Reviewed - No data to display  EKG None  Radiology DG Ankle Complete Right  Result Date: 09/26/2023 CLINICAL DATA:  Swelling for 3 days.  Right ankle injury. EXAM: RIGHT ANKLE - COMPLETE 3+ VIEW COMPARISON:  Right foot radiographs 08/28/2022 FINDINGS: The ankle mortise is symmetric and intact. Mild-to-moderate plantar calcaneal heel spur. Joint spaces are preserved. There is a 2 mm ossicle just distal to the fibula that is age indeterminate, seen on frontal view. IMPRESSION: 1. There is a 2 mm ossicle just distal to the fibula that is age indeterminate. This may represent a small avulsion fracture. Recommend correlation with point tenderness. 2. Mild-to-moderate plantar calcaneal heel spur. Electronically Signed   By: Neita Garnet M.D.   On: 09/26/2023 15:23    Procedures Procedures    Medications Ordered in ED Medications  oxyCODONE-acetaminophen (PERCOCET/ROXICET) 5-325 MG per tablet 1 tablet (1 tablet Oral Given 09/26/23 1623)    ED Course/ Medical Decision Making/ A&P  Medical Decision Making Keyira Stahl is a 62 y.o. female here with right ankle injury.  X-ray showed possible distal fibula fracture.  Patient has swelling over that area.  Will treat conservatively with cam walker.  Patient states that she has a hard time bearing weight on the leg even with a cam walker. Will give knee scooter.   Problems Addressed: Avulsion fracture of distal fibula: acute illness or injury  Amount and/or Complexity of Data Reviewed Radiology: ordered and independent interpretation performed. Decision-making details documented in ED Course.  Risk Prescription drug management.    Final Clinical Impression(s) / ED Diagnoses Final diagnoses:  None    Rx / DC Orders ED Discharge Orders          Ordered    For home use only DME 4 wheeled rolling walker with seat        09/26/23 1620               Charlynne Pander, MD 09/26/23 [phone]

## 2023-09-26 NOTE — Discharge Instructions (Addendum)
As we discussed you have a distal fibula fracture.  Take Tylenol or Motrin for pain and Percocet for severe pain.  Home health will contact you tomorrow regarding sending you a knee scooter  Please call orthopedic doctors office tomorrow for appointment next week  Return to ER if you have severe pain or swelling

## 2023-09-26 NOTE — Care Management (Signed)
ED RNCM spoke with patient concerning DME order for w/c patient states she does not want a wheelchair she believes she would benefit from a knee scooter.  RNCM discussed with Dr. Silverio Lay, and a knee scooter was ordered, contacted [employer], patient's insurance is out of network, patient made aware, to pay out of pocket.  Knee cannot be delivered until tomorrow she is agreeable, and agrees to pay out of pocket. Updated Dr. Silverio Lay. Patient will be discharged safely.  Will follow up with patient tomorrow.

## 2023-09-26 NOTE — Progress Notes (Signed)
Orthopedic Tech Progress Note Patient Details:  Gabrielle Ryan 11-13-1960 [id number]  Ortho Devices Type of Ortho Device: CAM walker Ortho Device/Splint Location: right Ortho Device/Splint Interventions: Ordered, Application, Adjustment   Post Interventions Patient Tolerated: Well Instructions Provided: Adjustment of device, Care of device  Kizzie Fantasia 09/26/2023, 4:29 PM

## 2024-02-14 ENCOUNTER — Emergency Department (HOSPITAL_COMMUNITY)

## 2024-02-14 ENCOUNTER — Encounter (HOSPITAL_COMMUNITY): Payer: Self-pay | Admitting: Emergency Medicine

## 2024-02-14 ENCOUNTER — Emergency Department (HOSPITAL_COMMUNITY)
Admission: EM | Admit: 2024-02-14 | Discharge: 2024-02-14 | Disposition: A | Discharge status: Home / Self Care | Attending: Emergency Medicine | Admitting: Emergency Medicine

## 2024-02-14 DIAGNOSIS — M25551 Pain in right hip: Secondary | ICD-10-CM | POA: Diagnosis present

## 2024-02-14 DIAGNOSIS — S39012A Strain of muscle, fascia and tendon of lower back, initial encounter: Secondary | ICD-10-CM

## 2024-02-14 DIAGNOSIS — R109 Unspecified abdominal pain: Secondary | ICD-10-CM | POA: Diagnosis not present

## 2024-02-14 DIAGNOSIS — Z91040 Latex allergy status: Secondary | ICD-10-CM | POA: Insufficient documentation

## 2024-02-14 MED ORDER — KETOROLAC TROMETHAMINE 30 MG/ML IJ SOLN
30.0000 mg | Freq: Once | INTRAMUSCULAR | Status: AC
  Administered 2024-02-14: 30 mg via INTRAMUSCULAR
  Filled 2024-02-14: qty 1

## 2024-02-14 MED ORDER — METHOCARBAMOL 500 MG PO TABS: 500.0000 mg | ORAL_TABLET | Freq: Two times a day (BID) | ORAL | 0 refills | Status: AC

## 2024-02-14 MED ORDER — LIDOCAINE 5 % EX PTCH: 1.0000 | MEDICATED_PATCH | CUTANEOUS | 0 refills | Status: AC

## 2024-02-14 MED ORDER — IBUPROFEN 600 MG PO TABS: 600.0000 mg | ORAL_TABLET | Freq: Four times a day (QID) | ORAL | 0 refills | Status: AC | PRN

## 2024-02-14 NOTE — ED Provider Notes (Signed)
 [place] EMERGENCY DEPARTMENT AT [facility] Provider Note   CSN: [id number] Arrival date & time: 02/14/24  0946     History  Chief Complaint  Patient presents with   Hip Pain    right    Ari Engelbrecht is a 63 y.o. female.  63 year old female presents with right hip and right flank pain after mechanical fall 3 days ago.  States she slipped and her legs tried to do the splits.  Did not strike her head.  Complains of sharp pain is worse when she tries to walk which starts in her right flank and goes down to her right lateral hip.  Denies any foot drop when walking.  Unresponsive to home medications.       Home Medications Prior to Admission medications   Medication Sig Start Date End Date Taking? Authorizing Provider  oxyCODONE-acetaminophen (PERCOCET) 5-325 MG tablet Take 1 tablet by mouth every 6 (six) hours as needed. 09/26/23   Charlynne Pander, MD      Allergies    Latex    Review of Systems   Review of Systems  All other systems reviewed and are negative.   Physical Exam Updated Vital Signs BP (!) 162/89 (BP Location: Left Arm)   Pulse 65   Temp 98 F (36.7 C) (Oral)   Resp 18   SpO2 98%  Physical Exam Vitals and nursing note reviewed.  Constitutional:      General: She is not in acute distress.    Appearance: Normal appearance. She is well-developed. She is not toxic-appearing.  HENT:     Head: Normocephalic and atraumatic.  Eyes:     General: Lids are normal.     Conjunctiva/sclera: Conjunctivae normal.     Pupils: Pupils are equal, round, and reactive to light.  Neck:     Thyroid: No thyroid mass.     Trachea: No tracheal deviation.  Cardiovascular:     Rate and Rhythm: Normal rate and regular rhythm.     Heart sounds: Normal heart sounds. No murmur heard.    No gallop.  Pulmonary:     Effort: Pulmonary effort is normal. No respiratory distress.     Breath sounds: Normal breath sounds. No stridor. No decreased breath sounds,  wheezing, rhonchi or rales.  Abdominal:     General: There is no distension.     Palpations: Abdomen is soft.     Tenderness: There is no abdominal tenderness. There is no rebound.  Musculoskeletal:        General: No tenderness. Normal range of motion.     Cervical back: Normal range of motion and neck supple.       Legs:     Comments: Neurovasc intact at right foot.  No shortening rotation of the right lower extremity  Skin:    General: Skin is warm and dry.     Findings: No abrasion or rash.  Neurological:     Mental Status: She is alert and oriented to person, place, and time. Mental status is at baseline.     GCS: GCS eye subscore is 4. GCS verbal subscore is 5. GCS motor subscore is 6.     Cranial Nerves: No cranial nerve deficit.     Sensory: No sensory deficit.     Motor: Motor function is intact.  Psychiatric:        Attention and Perception: Attention normal.        Speech: Speech normal.  Behavior: Behavior normal.     ED Results / Procedures / Treatments   Labs (all labs ordered are listed, but only abnormal results are displayed) Labs Reviewed - No data to display  EKG None  Radiology No results found.  Procedures Procedures    Medications Ordered in ED Medications  ketorolac (TORADOL) 30 MG/ML injection 30 mg (has no administration in time range)    ED Course/ Medical Decision Making/ A&P                                 Medical Decision Making Amount and/or Complexity of Data Reviewed Radiology: ordered.  Risk Prescription drug management.   X-ray of right hip and LS-spine were negative for fracture per my review interpretation.  Medicated for pain and feels better.  Will discharge home on analgesics as well as muscle relaxants.        Final Clinical Impression(s) / ED Diagnoses Final diagnoses:  None    Rx / DC Orders ED Discharge Orders     None         Lorre Nick, MD 02/14/24 1154

## 2024-02-14 NOTE — ED Triage Notes (Signed)
 Pt arriving POV with right hip pain. Pt reports she slipped on a chemical on the floor at work a couple days ago and now has severe pain in the right hip. Having difficulty putting weight on right leg. No obvious deformity. Pt took Tylenol & Advil approx 1 hr ago. Pain currently 4/10.
